# Patient Record
Sex: Female | Born: 1995 | Race: Black or African American | Hispanic: No | Marital: Single | Smoking: Former smoker
Health system: Southern US, Community
[De-identification: ages and names within clinical notes are randomized; demographics above are authoritative.]

## PROBLEM LIST (undated history)

## (undated) DIAGNOSIS — J45909 Unspecified asthma, uncomplicated: Secondary | ICD-10-CM

## (undated) HISTORY — PX: WISDOM TOOTH EXTRACTION: SHX21

## (undated) HISTORY — PX: TONSILLECTOMY: SUR1361

## (undated) HISTORY — PX: ADENOIDECTOMY: SUR15

---
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
## 2018-03-26 IMAGING — DX DG KNEE 1-2V*R*
2 series · 2 of 2 positions shown · non-contrast
Comparison: None.

CLINICAL DATA: Right knee pain for 3 days, no known injury, initial
encounter

EXAM:
RIGHT KNEE - 2 VIEW

[knee ap]
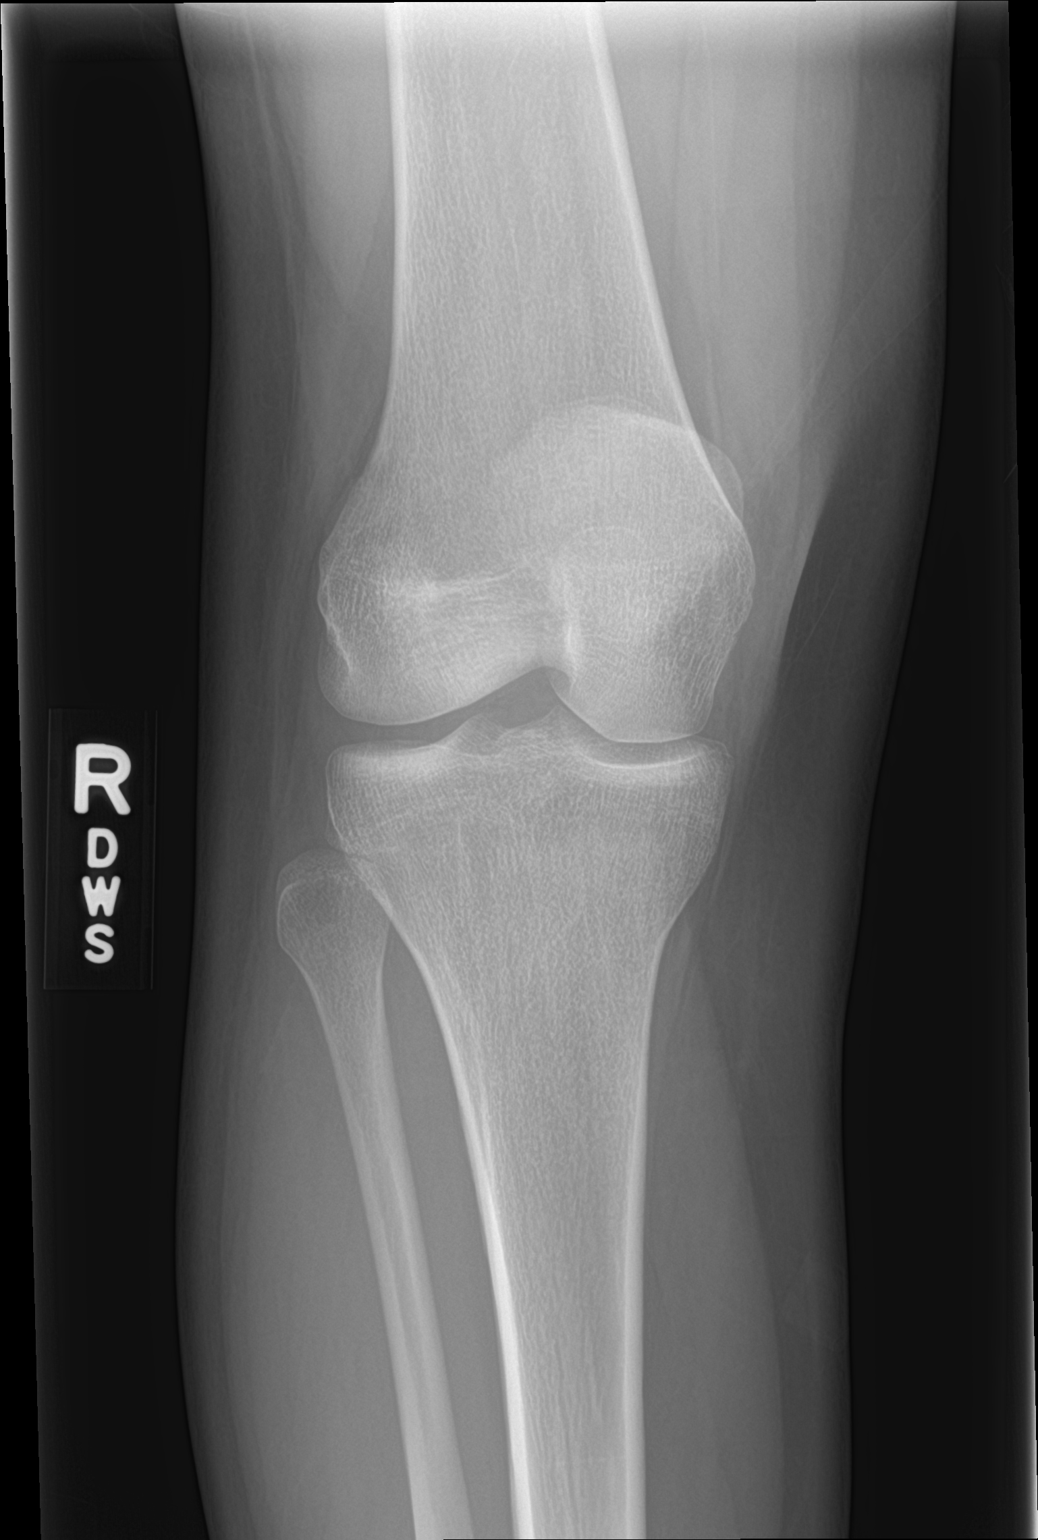

[knee lat]
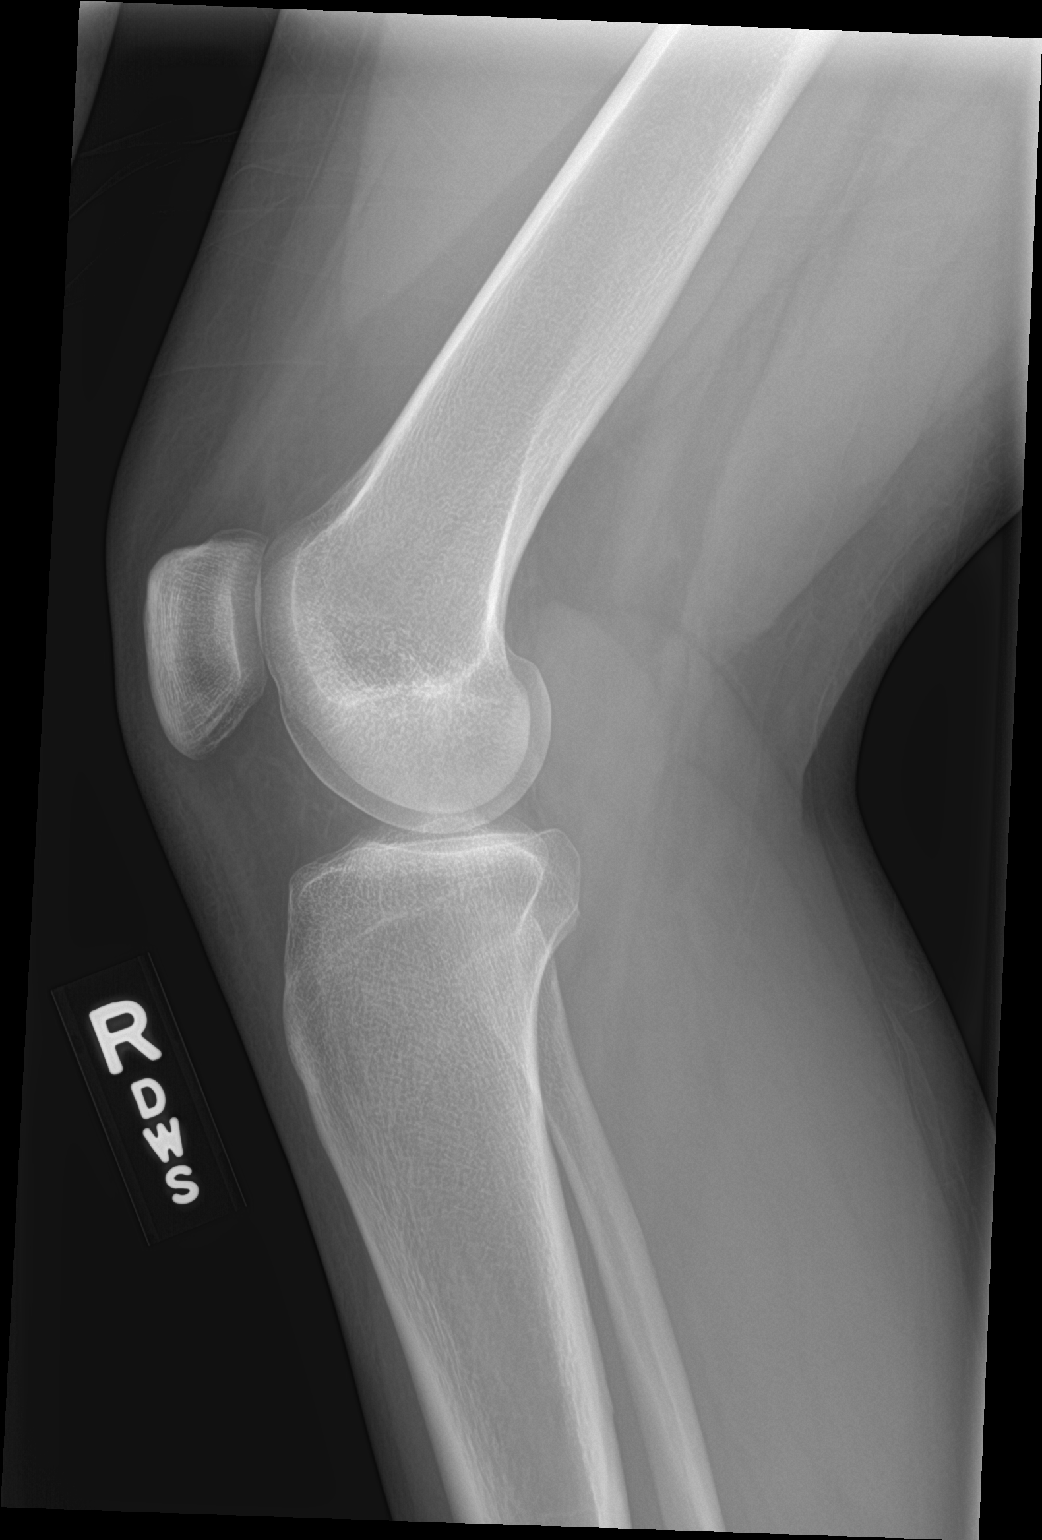

[2 of 2 positions shown; findings below may reference images not displayed]

FINDINGS: No evidence of fracture, dislocation, or joint effusion. No evidence
of arthropathy or other focal bone abnormality. Soft tissues are
unremarkable.
IMPRESSION: No acute abnormality noted.

## 2019-11-18 ENCOUNTER — Other Ambulatory Visit: Payer: Self-pay

## 2019-11-18 ENCOUNTER — Ambulatory Visit (HOSPITAL_COMMUNITY)
Admission: EM | Admit: 2019-11-18 | Discharge: 2019-11-18 | Disposition: A | Discharge status: Home / Self Care | Payer: Self-pay | Attending: Internal Medicine | Admitting: Internal Medicine

## 2019-11-18 ENCOUNTER — Encounter (HOSPITAL_COMMUNITY): Payer: Self-pay

## 2019-11-18 DIAGNOSIS — J029 Acute pharyngitis, unspecified: Secondary | ICD-10-CM | POA: Insufficient documentation

## 2019-11-18 DIAGNOSIS — B2790 Infectious mononucleosis, unspecified without complication: Secondary | ICD-10-CM | POA: Insufficient documentation

## 2019-11-18 LAB — CBC WITH DIFFERENTIAL/PLATELET
Abs Immature Granulocytes: 0.06 10*3/uL (ref 0.00–0.07)
Basophils Absolute: 0.2 10*3/uL — ABNORMAL HIGH (ref 0.0–0.1)
Basophils Relative: 1 %
Eosinophils Absolute: 0 10*3/uL (ref 0.0–0.5)
Eosinophils Relative: 0 %
HCT: 32.4 % — ABNORMAL LOW (ref 36.0–46.0)
Hemoglobin: 10.9 g/dL — ABNORMAL LOW (ref 12.0–15.0)
Immature Granulocytes: 0 %
Lymphocytes Relative: 64 %
Lymphs Abs: 12.4 10*3/uL — ABNORMAL HIGH (ref 0.7–4.0)
MCH: 26.7 pg (ref 26.0–34.0)
MCHC: 33.6 g/dL (ref 30.0–36.0)
MCV: 79.2 fL — ABNORMAL LOW (ref 80.0–100.0)
Monocytes Absolute: 2.2 10*3/uL — ABNORMAL HIGH (ref 0.1–1.0)
Monocytes Relative: 11 %
Neutro Abs: 4.8 10*3/uL (ref 1.7–7.7)
Neutrophils Relative %: 24 %
Platelets: 217 10*3/uL (ref 150–400)
RBC: 4.09 MIL/uL (ref 3.87–5.11)
RDW: 13.7 % (ref 11.5–15.5)
WBC: 19.5 10*3/uL — ABNORMAL HIGH (ref 4.0–10.5)
nRBC: 0 % (ref 0.0–0.2)

## 2019-11-18 LAB — HIV ANTIBODY (ROUTINE TESTING W REFLEX): HIV Screen 4th Generation wRfx: NONREACTIVE

## 2019-11-18 LAB — COMPREHENSIVE METABOLIC PANEL
ALT: 149 U/L — ABNORMAL HIGH (ref 0–44)
AST: 62 U/L — ABNORMAL HIGH (ref 15–41)
Albumin: 3.7 g/dL (ref 3.5–5.0)
Alkaline Phosphatase: 204 U/L — ABNORMAL HIGH (ref 38–126)
Anion gap: 9 (ref 5–15)
BUN: 7 mg/dL (ref 6–20)
CO2: 22 mmol/L (ref 22–32)
Calcium: 9 mg/dL (ref 8.9–10.3)
Chloride: 107 mmol/L (ref 98–111)
Creatinine, Ser: 0.72 mg/dL (ref 0.44–1.00)
GFR calc Af Amer: >60 mL/min (ref >60)
GFR calc non Af Amer: >60 mL/min (ref >60)
Glucose, Bld: 108 mg/dL — ABNORMAL HIGH (ref 70–99)
Potassium: 4.3 mmol/L (ref 3.5–5.1)
Sodium: 138 mmol/L (ref 135–145)
Total Bilirubin: 0.8 mg/dL (ref 0.3–1.2)
Total Protein: 7.4 g/dL (ref 6.5–8.1)

## 2019-11-18 MED ORDER — MAGIC MOUTHWASH W/LIDOCAINE: ORAL | 0 refills | Status: DC

## 2019-11-18 MED ORDER — CVS SORE THROAT SPRAY 1.4 % MT LIQD: 1.0000 | OROMUCOSAL | 0 refills | Status: DC | PRN

## 2019-11-18 NOTE — ED Provider Notes (Signed)
[facility]    CSN: [id number] Arrival date & time: 11/18/19  8242      History   Chief Complaint Chief Complaint  Patient presents with  . Sore Throat    HPI Teresa Saunders is a 24 y.o. female who was recently diagnosed with infectious mononucleosis with severe pharyngitis comes to urgent care with complaints of worsening throat pain, difficulty swallowing.  [facility] and was diagnosed with infectious mononucleosis.  Patient continues to have severe throat pain, difficulty swallowing and poor oral intake.  Patient has swelling in his cervical and posterior occipital glands.  No abdominal pain or distention.  No rash noted on the skin.  No diarrhea.  No night sweats.  No fever or chills. HPI  Past Medical History:  Diagnosis Date  . Asthma     There are no problems to display for this patient.   Past Surgical History:  Procedure Laterality Date  . ADENOIDECTOMY    . TONSILLECTOMY    . WISDOM TOOTH EXTRACTION      OB History   No obstetric history on file.      Home Medications    Prior to Admission medications   Medication Sig Start Date End Date Taking? Authorizing Provider  ibuprofen (ADVIL,MOTRIN) 800 MG tablet Take 1 tablet (800 mg total) by mouth 3 (three) times daily. 06/13/16   Domenick Gong, MD  magic mouthwash w/lidocaine SOLN Please swish and spit every 6hours as needed for throat pain  Formulation: Diphenhydramine 12.5mg /ml- 55ml Maalox-73ml Viscous Lidocaine 2%-60 ml Nystatin 100,000uniuts/ml-60ml 11/18/19   Jenilee Franey, Britta Mccreedy, MD  phenol ([facility] SORE THROAT SPRAY) 1.4 % LIQD Use as directed 1 spray in the mouth or throat as needed for throat irritation / pain. 11/18/19   LampteyBritta Mccreedy, MD    Family History Family History  Problem Relation Age of Onset  . Cancer Mother   . Seizures Sister   . Seizures Brother     Social History Social History   Tobacco Use  . Smoking status: Never  Smoker  . Smokeless tobacco: Never Used  Substance Use Topics  . Alcohol use: Yes    Comment: 1-2 cups of beer 3xwk  . Drug use: Never     Allergies   Patient has no known allergies.   Review of Systems Review of Systems  Constitutional: Positive for activity change, chills and fatigue. Negative for fever.  HENT: Positive for congestion, ear pain and sore throat. Negative for sinus pressure, sinus pain and voice change.   Respiratory: Negative for chest tightness and shortness of breath.   Gastrointestinal: Negative for abdominal pain, nausea and vomiting.  Musculoskeletal: Negative for arthralgias and joint swelling.  Skin: Negative.   Neurological: Negative for dizziness, light-headedness and headaches.  Psychiatric/Behavioral: Negative for confusion and decreased concentration.     Physical Exam Triage Vital Signs ED Triage Vitals [11/18/19 0911]  Enc Vitals Group     BP 135/87     Pulse Rate 99     Resp 18     Temp 98.6 F (37 C)     Temp Source Axillary     SpO2 99 %     Weight 156 lb (70.8 kg)     Height 5\' 3"  (1.6 m)     Head Circumference      Peak Flow      Pain Score 9     Pain Loc      Pain  Edu?      Excl. in [place]?    No data found.  Updated Vital Signs BP 135/87   Pulse 99   Temp 98.6 F (37 C) (Axillary)   Resp 18   Ht 5\' 3"  (1.6 m)   Wt 70.8 kg   SpO2 99%   BMI 27.63 kg/m   Visual Acuity Right Eye Distance:   Left Eye Distance:   Bilateral Distance:    Right Eye Near:   Left Eye Near:    Bilateral Near:     Physical Exam Vitals and nursing note reviewed.  Constitutional:      General: She is in acute distress.     Appearance: She is ill-appearing.  HENT:     Right Ear: Tympanic membrane normal.     Left Ear: Tympanic membrane normal.     Nose: No rhinorrhea.     Mouth/Throat:     Mouth: Mucous membranes are moist.     Pharynx: Uvula midline. Posterior oropharyngeal erythema present.     Tonsils: 1+ on the right. 1+ on the  left.  Cardiovascular:     Rate and Rhythm: Normal rate and regular rhythm.  Pulmonary:     Effort: Pulmonary effort is normal. No respiratory distress.     Breath sounds: No stridor.  Abdominal:     General: There is no distension.     Palpations: Abdomen is soft.     Tenderness: There is no guarding or rebound.  Musculoskeletal:     Cervical back: Normal range of motion.  Lymphadenopathy:     Cervical: Cervical adenopathy present.  Skin:    Capillary Refill: Capillary refill takes less than 2 seconds.  Neurological:     Mental Status: She is alert.      UC Treatments / Results  Labs (all labs ordered are listed, but only abnormal results are displayed) Labs Reviewed  CBC WITH DIFFERENTIAL/PLATELET  COMPREHENSIVE METABOLIC PANEL  HIV ANTIBODY (ROUTINE TESTING W REFLEX)    EKG   Radiology No results found.  Procedures Procedures (including critical care time)  Medications Ordered in UC Medications - No data to display  Initial Impression / Assessment and Plan / UC Course  I have reviewed the triage vital signs and the nursing notes.  Pertinent labs & imaging results that were available during my care of the patient were reviewed by me and considered in my medical decision making (see chart for details).     1.  Infectious mononucleosis with occipital and cervical lymphadenopathy: CBC, BMP, HIV Mouthwash with lidocaine swish and swallow Increase oral fluid intake Phenol throat spray as needed Return precautions given Patient is plan of care will be updated once lab results are available. Final Clinical Impressions(s) / UC Diagnoses   Final diagnoses:  Viral pharyngitis  Infectious mononucleosis without complication, infectious mononucleosis due to unspecified organism   Discharge Instructions   None    ED Prescriptions    Medication Sig Dispense Auth. Provider   phenol ([facility] SORE THROAT SPRAY) 1.4 % LIQD Use as directed 1 spray in the mouth or throat  as needed for throat irritation / pain.  Chase Picket, MD   magic mouthwash w/lidocaine SOLN Please swish and spit 55mls every 6hours as needed for throat pain  Formulation: Diphenhydramine 12.5mg /ml- 72ml Maalox-8ml Viscous Lidocaine 2%-60 ml Nystatin 100,000uniuts/ml-55ml 240 mL Benjiman Sedgwick, Myrene Galas, MD     PDMP not reviewed this encounter.   Chase Picket, MD 11/18/19 1009

## 2019-11-18 NOTE — ED Triage Notes (Signed)
Pt states she went to [facility] on Friday and they pricked her finger and told her she has mono. Pt states the right side of her throat is starting to be more swollen. Pt also states her ears hurt to swallow and talk and she's still having throat pain. Pt states she is able to eat and drink, but it's painful. Pt states [facility] gave her mylanta mouth wash, but it's not helping. Pt states the motrin she's been taking isn't helping and she has nasal congestion.

## 2019-11-19 ENCOUNTER — Telehealth (HOSPITAL_COMMUNITY): Payer: Self-pay | Admitting: Internal Medicine

## 2019-11-19 MED ORDER — PREDNISONE 20 MG PO TABS: 20.0000 mg | ORAL_TABLET | Freq: Every day | ORAL | 0 refills | Status: AC

## 2019-11-19 NOTE — Telephone Encounter (Signed)
Patient continues to have significant throat pain and difficulty swallowing solid foods.  No stridor or difficulty breathing.  No wheezing.  Lab review showed white cell count of 19,000 with reactive lymphocytosis.  Liver enzymes are elevated with ALT of 149 and AST of 62 and alkaline phosphatase of 204.  Short course of steroids will be called into the pharmacy for the patient.  Hopefully that will help with the throat swelling.  I explained to the patient that the steroids has not shorten the duration of the disease process itself but may bring some relief regarding the throat swelling.

## 2020-08-08 ENCOUNTER — Ambulatory Visit: Payer: Medicaid Other

## 2021-03-25 ENCOUNTER — Ambulatory Visit: Payer: Self-pay

## 2021-06-15 ENCOUNTER — Other Ambulatory Visit: Payer: Self-pay

## 2021-06-15 ENCOUNTER — Ambulatory Visit
Admission: EM | Admit: 2021-06-15 | Discharge: 2021-06-15 | Disposition: A | Discharge status: Home / Self Care | Payer: Managed Care, Other (non HMO) | Attending: Emergency Medicine | Admitting: Emergency Medicine

## 2021-06-15 ENCOUNTER — Encounter: Payer: Self-pay | Admitting: Emergency Medicine

## 2021-06-15 DIAGNOSIS — R5383 Other fatigue: Secondary | ICD-10-CM

## 2021-06-15 DIAGNOSIS — R4189 Other symptoms and signs involving cognitive functions and awareness: Secondary | ICD-10-CM

## 2021-06-15 DIAGNOSIS — R110 Nausea: Secondary | ICD-10-CM

## 2021-06-15 DIAGNOSIS — J029 Acute pharyngitis, unspecified: Secondary | ICD-10-CM

## 2021-06-15 DIAGNOSIS — R0602 Shortness of breath: Secondary | ICD-10-CM

## 2021-06-15 DIAGNOSIS — Z8709 Personal history of other diseases of the respiratory system: Secondary | ICD-10-CM

## 2021-06-15 DIAGNOSIS — B349 Viral infection, unspecified: Secondary | ICD-10-CM | POA: Diagnosis not present

## 2021-06-15 MED ORDER — AEROCHAMBER PLUS FLO-VU SMALL MISC: 1.0000 | Freq: Once | Status: DC

## 2021-06-15 MED ORDER — DEXAMETHASONE 6 MG PO TABS: 6.0000 mg | ORAL_TABLET | Freq: Two times a day (BID) | ORAL | 0 refills | Status: DC

## 2021-06-15 MED ORDER — ALBUTEROL SULFATE HFA 108 (90 BASE) MCG/ACT IN AERS: 1.0000 | INHALATION_SPRAY | Freq: Four times a day (QID) | RESPIRATORY_TRACT | 0 refills | Status: DC | PRN

## 2021-06-15 MED ORDER — ALBUTEROL SULFATE HFA 108 (90 BASE) MCG/ACT IN AERS: 2.0000 | INHALATION_SPRAY | Freq: Once | RESPIRATORY_TRACT | Status: DC

## 2021-06-15 MED ORDER — AEROCHAMBER PLUS FLO-VU LARGE MISC: 1.0000 | Freq: Once | 0 refills | Status: AC

## 2021-06-15 NOTE — ED Provider Notes (Signed)
[facility]    CSN: [id number] Arrival date & time: 06/15/21  6644      History   Chief Complaint Chief Complaint  Patient presents with   Chills   Shortness of Breath   Fatigue    HPI Teresa Saunders is a 25 y.o. female.   Pt reports starting yesterday she began feeling fatigued, nauseous, having chills, and having SOB.  Per EMR, patient reports a past medical history of asthma however I do not see any encounters regarding this issue or any former prescriptions for asthma medication or treatment. Patient tested positive for mononucleosis in March 2021.  Patient also reports that she vapes every day.  Patient states that yesterday afternoon at work, she works in a distribution center processing unloading, she began to feel very tired, dizzy, short of breath.  States that that is her busiest day and they get in trouble with a leave early so she just push through and she is about the end of her shift she was feeling better.  Unfortunately, last night she began to experience increased fatigue, chills, feel weak, nausea, dizziness, brain fog and have a sore throat.  Patient denies cough, congestion, headache at this time.  Patient denies sick contacts.  Patient states she is on had episodes like this before.  Patient reports that her history of asthma is remote, states she was very [place] and has not required treatment in greater than 10 years.  Patient describes her shortness of breath as an increased work of breathing, states it is difficult for her to take a very deep breath and when she does does not feel refreshed.  The history is provided by the patient.   Past Medical History:  Diagnosis Date   Asthma     There are no problems to display for this patient.   Past Surgical History:  Procedure Laterality Date   ADENOIDECTOMY     TONSILLECTOMY     WISDOM TOOTH EXTRACTION      OB History   No obstetric history on file.      Home Medications    Prior to  Admission medications   Medication Sig Start Date End Date Taking? Authorizing Provider  albuterol (VENTOLIN HFA) 108 (90 Base) MCG/ACT inhaler Inhale 1-2 puffs into the lungs every 6 (six) hours as needed for wheezing or shortness of breath. 06/15/21  Yes Theadora Rama Scales, PA-C  dexamethasone (DECADRON) 6 MG tablet Take 1 tablet (6 mg total) by mouth 2 (two) times daily with a meal for 5 days. 06/15/21 06/20/21 Yes Theadora Rama Scales, PA-C  Spacer/Aero-Holding Chambers (AEROCHAMBER PLUS FLO-VU LARGE) MISC 1 each by Other route once for 1 dose. 06/15/21 06/15/21 Yes Theadora Rama Scales, PA-C    Family History Family History  Problem Relation Age of Onset   Cancer Mother    Seizures Sister    Seizures Brother     Social History Social History   Tobacco Use   Smoking status: Former    Types: Cigarettes, E-cigarettes   Smokeless tobacco: Current  Vaping Use   Vaping Use: Former  Substance Use Topics   Alcohol use: Yes    Comment: 1-2 cups of beer 3xwk   Drug use: Never     Allergies   Patient has no known allergies.   Review of Systems Review of Systems Pertinent findings noted in history of present illness.    Physical Exam Triage Vital Signs ED Triage Vitals  Enc Vitals Group  BP      Pulse      Resp      Temp      Temp src      SpO2      Weight      Height      Head Circumference      Peak Flow      Pain Score      Pain Loc      Pain Edu?      Excl. in [place]?    No data found.  Updated Vital Signs BP 118/74 (BP Location: Right Arm)   Pulse 67   Temp 97.9 F (36.6 C) (Oral)   Resp 18   Wt 150 lb (68 kg)   LMP 06/01/2021 (Approximate) Comment: Patient states cycles are irregular  SpO2 97%   BMI 26.57 kg/m   Visual Acuity Right Eye Distance:   Left Eye Distance:   Bilateral Distance:    Right Eye Near:   Left Eye Near:    Bilateral Near:     Physical Exam   UC Treatments / Results  Labs (all labs ordered are listed, but  only abnormal results are displayed) Labs Reviewed  COVID-19, FLU A+B NAA    EKG   Radiology No results found.  Procedures Procedures (including critical care time)  Medications Ordered in UC Medications - No data to display  Initial Impression / Assessment and Plan / UC Course  I have reviewed the triage vital signs and the nursing notes.  Pertinent labs & imaging results that were available during my care of the patient were reviewed by me and considered in my medical decision making (see chart for details).     Patient was provided with an albuterol inhaler/spacer as well as a 5-day course of Decadron for decreased breath sounds appreciated on exam and shortness of breath as reported by patient.  Unable to perform albuterol and treatment in office due to not having any albuterol available.  Patient was advised that results of COVID and flu testing would be made available to her once we have received them.  Patient was provided with a note for work.  Patient verbalized understanding and agreement of plan as discussed.  All questions were addressed during visit.  Please see discharge instructions below for further details of plan.  Final Clinical Impressions(s) / UC Diagnoses   Final diagnoses:  Shortness of breath  Sore throat  Other fatigue  Brain fog  Nausea without vomiting  Viral illness  History of asthma     Discharge Instructions      I recommend that you begin inhaling 2 puffs of albuterol twice daily while you are not feeling well and continue to do so whenever you feel that you are having a cough or beginning to feel sick.  Please also begin Decadron for shortness of breath, 1 tablet twice daily for 5 days.  Please quarantine at home until the results of your COVID and flu testing are returned, should be within the next 12 to 24 hours.  Please be advised, if either test is positive you will need to continue to quarantine at home for the next 5 days for COVID  or until symptoms have completely resolved for flu.  Conservative care is recommended including clear fluids, activity as tolerated, rest as tolerated, eating as tolerated, Tylenol and ibuprofen.     ED Prescriptions     Medication Sig Dispense Auth. Provider   dexamethasone (DECADRON) 6 MG tablet Take  1 tablet (6 mg total) by mouth 2 (two) times daily with a meal for 5 days. 10 tablet Theadora Rama Scales, PA-C   Spacer/Aero-Holding Chambers (AEROCHAMBER PLUS FLO-VU LARGE) MISC 1 each by Other route once for 1 dose. 1 each Theadora Rama Scales, PA-C   albuterol (VENTOLIN HFA) 108 (90 Base) MCG/ACT inhaler Inhale 1-2 puffs into the lungs every 6 (six) hours as needed for wheezing or shortness of breath. 18 g Theadora Rama Scales, PA-C      PDMP not reviewed this encounter.   Theadora Rama Scales, PA-C 06/15/21 1049

## 2021-06-15 NOTE — Discharge Instructions (Addendum)
I recommend that you begin inhaling 2 puffs of albuterol twice daily while you are not feeling well and continue to do so whenever you feel that you are having a cough or beginning to feel sick.  Please also begin Decadron for shortness of breath, 1 tablet twice daily for 5 days.  Please quarantine at home until the results of your COVID and flu testing are returned, should be within the next 12 to 24 hours.  Please be advised, if either test is positive you will need to continue to quarantine at home for the next 5 days for COVID or until symptoms have completely resolved for flu.  Conservative care is recommended including clear fluids, activity as tolerated, rest as tolerated, eating as tolerated, Tylenol and ibuprofen.

## 2021-06-15 NOTE — ED Triage Notes (Signed)
Pt reports starting yesterday she began feeling fatigued, nauseous, having chills, and having SOB.  Patient displays no physical s/s of respiratory distress at this time.

## 2021-06-17 LAB — COVID-19, FLU A+B NAA
Influenza A, NAA: NOT DETECTED
Influenza B, NAA: NOT DETECTED
SARS-CoV-2, NAA: NOT DETECTED

## 2021-06-18 ENCOUNTER — Telehealth: Payer: Self-pay

## 2021-06-18 MED ORDER — DEXAMETHASONE 6 MG PO TABS: 6.0000 mg | ORAL_TABLET | Freq: Two times a day (BID) | ORAL | 0 refills | Status: AC

## 2021-06-18 NOTE — Telephone Encounter (Signed)
Pt asked to have rx resent to different pharmacy.

## 2021-07-10 ENCOUNTER — Emergency Department (HOSPITAL_COMMUNITY)
Admission: EM | Admit: 2021-07-10 | Discharge: 2021-07-10 | Disposition: A | Discharge status: Home / Self Care | Payer: Managed Care, Other (non HMO) | Attending: Emergency Medicine | Admitting: Emergency Medicine

## 2021-07-10 ENCOUNTER — Other Ambulatory Visit: Payer: Self-pay

## 2021-07-10 ENCOUNTER — Encounter (HOSPITAL_COMMUNITY): Payer: Self-pay

## 2021-07-10 DIAGNOSIS — J45909 Unspecified asthma, uncomplicated: Secondary | ICD-10-CM | POA: Insufficient documentation

## 2021-07-10 DIAGNOSIS — F17210 Nicotine dependence, cigarettes, uncomplicated: Secondary | ICD-10-CM | POA: Insufficient documentation

## 2021-07-10 DIAGNOSIS — F10929 Alcohol use, unspecified with intoxication, unspecified: Secondary | ICD-10-CM

## 2021-07-10 DIAGNOSIS — Z79899 Other long term (current) drug therapy: Secondary | ICD-10-CM | POA: Diagnosis not present

## 2021-07-10 DIAGNOSIS — F10129 Alcohol abuse with intoxication, unspecified: Secondary | ICD-10-CM | POA: Diagnosis not present

## 2021-07-10 LAB — RAPID URINE DRUG SCREEN, HOSP PERFORMED
Amphetamines: NOT DETECTED
Barbiturates: NOT DETECTED
Benzodiazepines: NOT DETECTED
Cocaine: NOT DETECTED
Opiates: NOT DETECTED
Tetrahydrocannabinol: NOT DETECTED

## 2021-07-10 LAB — POC URINE PREG, ED: Preg Test, Ur: NEGATIVE

## 2021-07-10 MED ORDER — HALOPERIDOL LACTATE 5 MG/ML IJ SOLN
INTRAMUSCULAR | Status: AC
  Administered 2021-07-10: 5 mg
  Filled 2021-07-10: qty 1

## 2021-07-10 MED ORDER — HALOPERIDOL LACTATE 5 MG/ML IJ SOLN: 2.0000 mg | Freq: Once | INTRAMUSCULAR | Status: DC

## 2021-07-10 NOTE — ED Notes (Signed)
Have called all contacts with no answer. Patient does not have phone or wallet with them.

## 2021-07-10 NOTE — ED Provider Notes (Signed)
Patient is now awake and alert.  She has no complaints other than she feels like she is hung over.  She does not appear to be responding to internal stimuli.  She is calm and cooperative.  She would like to go home.  She was ambulating without difficulty.  Will discharge home when she is able to get a hold of her friend.   Gwyneth Sprout, MD 07/10/21 [phone]

## 2021-07-10 NOTE — ED Triage Notes (Addendum)
Patient BIB [facility] at a bar. [employer] arrived. EMS showed up and patient was violent from the start. She was going to go to jail but they thought she was too drunk. Patient arrived in violent restraints. Patient screaming "Get out of my face white cracker bitch." Patient screamed "you took away my reproductive rights by controlling her vagina."    Dr. Nicanor Alcon at bedside gave verbal order 5mg  IM Haldol.

## 2021-07-10 NOTE — ED Provider Notes (Addendum)
[facility]-EMERGENCY DEPT Provider Note   CSN: [id number] Arrival date & time: 07/10/21  0127     History Chief Complaint  Patient presents with   Psychiatric Evaluation   Aggressive Behavior    Teresa Saunders is a 25 y.o. female.  The history is provided by the EMS personnel and the police. The history is limited by the condition of the patient.  Alcohol Intoxication This is a new problem. The problem occurs constantly. The problem has not changed since onset.Pertinent negatives include no shortness of breath. Nothing aggravates the symptoms. Nothing relieves the symptoms. She has tried nothing for the symptoms. The treatment provided no relief.  Found at a bar and was screaming and belligerent and reportedly was drinking ETOH unknown amount.    Past Medical History:  Diagnosis Date   Asthma     There are no problems to display for this patient.   Past Surgical History:  Procedure Laterality Date   ADENOIDECTOMY     TONSILLECTOMY     WISDOM TOOTH EXTRACTION       OB History   No obstetric history on file.     Family History  Problem Relation Age of Onset   Cancer Mother    Seizures Sister    Seizures Brother     Social History   Tobacco Use   Smoking status: Former    Types: Cigarettes, E-cigarettes   Smokeless tobacco: Current  Vaping Use   Vaping Use: Former  Substance Use Topics   Alcohol use: Yes    Comment: 1-2 cups of beer 3xwk   Drug use: Never    Home Medications Prior to Admission medications   Medication Sig Start Date End Date Taking? Authorizing Provider  albuterol (VENTOLIN HFA) 108 (90 Base) MCG/ACT inhaler Inhale 1-2 puffs into the lungs every 6 (six) hours as needed for wheezing or shortness of breath. 06/15/21   Theadora Rama Scales, PA-C  Spacer/Aero-Holding Chambers (AEROCHAMBER PLUS FLO-VU LARGE) MISC 1 each by Other route once for 1 dose. 06/15/21 06/15/21  Theadora Rama Scales, PA-C     Allergies    Patient has no known allergies.  Review of Systems   Review of Systems  Unable to perform ROS: Acuity of condition  HENT:  Negative for facial swelling.   Eyes:  Negative for redness.  Respiratory:  Negative for shortness of breath.   Cardiovascular:  Negative for leg swelling.  Gastrointestinal:  Negative for vomiting.  Musculoskeletal:  Negative for neck stiffness.  Skin:  Negative for wound.  Neurological:  Negative for facial asymmetry.  Psychiatric/Behavioral:  Positive for agitation.    Physical Exam Updated Vital Signs BP (!) 104/47   Pulse 83   Resp 19   SpO2 95%   Physical Exam Vitals and nursing note reviewed. Exam conducted with a chaperone present.  Constitutional:      Appearance: Normal appearance. She is not diaphoretic.  HENT:     Head: Normocephalic and atraumatic.     Nose: Nose normal.     Mouth/Throat:     Mouth: Mucous membranes are moist.     Pharynx: Oropharynx is clear.  Eyes:     Conjunctiva/sclera: Conjunctivae normal.     Pupils: Pupils are equal, round, and reactive to light.  Cardiovascular:     Rate and Rhythm: Normal rate and regular rhythm.     Pulses: Normal pulses.     Heart sounds: Normal heart sounds.  Pulmonary:     Effort: Pulmonary  effort is normal. No respiratory distress.     Breath sounds: Normal breath sounds. No wheezing or rales.  Abdominal:     General: Abdomen is flat. Bowel sounds are normal.     Palpations: Abdomen is soft.     Tenderness: There is no abdominal tenderness. There is no guarding.  Musculoskeletal:        General: Normal range of motion.     Cervical back: Normal range of motion and neck supple.     Right lower leg: No edema.     Left lower leg: No edema.  Skin:    General: Skin is warm and dry.     Capillary Refill: Capillary refill takes less than 2 seconds.  Neurological:     General: No focal deficit present.     Mental Status: She is alert and oriented to person, place, and  time.     Deep Tendon Reflexes: Reflexes normal.  Psychiatric:        Behavior: Behavior is agitated and aggressive.     Comments: Shouting slurs at staff and police     ED Results / Procedures / Treatments   Labs (all labs ordered are listed, but only abnormal results are displayed) Labs Reviewed  RAPID URINE DRUG SCREEN, HOSP PERFORMED  POC URINE PREG, ED    EKG None  Radiology No results found.  Procedures Procedures   Medications Ordered in ED Medications  haloperidol lactate (HALDOL) injection 2 mg (0 mg Intramuscular Hold 07/10/21 0159)  haloperidol lactate (HALDOL) 5 MG/ML injection (5 mg  Given 07/10/21 0158)  haloperidol lactate (HALDOL) 5 MG/ML injection (5 mg  Given 07/10/21 0158)    ED Course  I have reviewed the triage vital signs and the nursing notes.  Pertinent labs & imaging results that were available during my care of the patient were reviewed by me and considered in my medical decision making (see chart for details).   Resting comfortably post haldol.     Teresa Saunders was evaluated in Emergency Department on 07/10/2021 for the symptoms described in the history of present illness. She was evaluated in the context of the global COVID-19 pandemic, which necessitated consideration that the patient might be at risk for infection with the SARS-CoV-2 virus that causes COVID-19. Institutional protocols and algorithms that pertain to the evaluation of patients at risk for COVID-19 are in a state of rapid change based on information released by regulatory bodies including the CDC and federal and state organizations. These policies and algorithms were followed during the patient's care in the ED.  Final Clinical Impression(s) / ED Diagnoses Final diagnoses:  Alcoholic intoxication with complication [facility])   Will need am reassessment by Dr. Lorenza Cambridge, Teresa Termine, MD 07/10/21 [phone]

## 2021-07-27 ENCOUNTER — Other Ambulatory Visit: Payer: Self-pay

## 2021-07-27 ENCOUNTER — Ambulatory Visit
Admission: EM | Admit: 2021-07-27 | Discharge: 2021-07-27 | Disposition: A | Discharge status: Home / Self Care | Payer: Managed Care, Other (non HMO) | Attending: Emergency Medicine | Admitting: Emergency Medicine

## 2021-07-27 DIAGNOSIS — J029 Acute pharyngitis, unspecified: Secondary | ICD-10-CM

## 2021-07-27 DIAGNOSIS — J101 Influenza due to other identified influenza virus with other respiratory manifestations: Secondary | ICD-10-CM | POA: Diagnosis not present

## 2021-07-27 DIAGNOSIS — M791 Myalgia, unspecified site: Secondary | ICD-10-CM

## 2021-07-27 DIAGNOSIS — R059 Cough, unspecified: Secondary | ICD-10-CM

## 2021-07-27 LAB — POCT INFLUENZA A/B
Influenza A, POC: POSITIVE — AB
Influenza B, POC: NEGATIVE

## 2021-07-27 MED ORDER — OSELTAMIVIR PHOSPHATE 75 MG PO CAPS: 75.0000 mg | ORAL_CAPSULE | Freq: Two times a day (BID) | ORAL | 0 refills | Status: DC

## 2021-07-27 NOTE — ED Provider Notes (Addendum)
[facility]    CSN: [id number] Arrival date & time: 07/27/21  1541    HISTORY  No chief complaint on file.  HPI Teresa Saunders is a 25 y.o. female. Patient reports muscle ache, sore throat and cough that started yesterday.  Patient states he took a COVID test at home yesterday and today and that were both negative.  Vital signs are stable on arrival today.  Patient is in no acute distress, well-appearing.  The history is provided by the patient.  Past Medical History:  Diagnosis Date   Asthma    There are no problems to display for this patient.  Past Surgical History:  Procedure Laterality Date   ADENOIDECTOMY     TONSILLECTOMY     WISDOM TOOTH EXTRACTION     OB History   No obstetric history on file.    Home Medications    Prior to Admission medications   Medication Sig Start Date End Date Taking? Authorizing Provider  oseltamivir (TAMIFLU) 75 MG capsule Take 1 capsule (75 mg total) by mouth every 12 (twelve) hours. 07/27/21  Yes Theadora Rama Scales, PA-C  albuterol (VENTOLIN HFA) 108 (90 Base) MCG/ACT inhaler Inhale 1-2 puffs into the lungs every 6 (six) hours as needed for wheezing or shortness of breath. 06/15/21   Theadora Rama Scales, PA-C  Spacer/Aero-Holding Chambers (AEROCHAMBER PLUS FLO-VU LARGE) MISC 1 each by Other route once for 1 dose. 06/15/21 06/15/21  Theadora Rama Scales, PA-C   Family History Family History  Problem Relation Age of Onset   Cancer Mother    Seizures Sister    Seizures Brother    Social History Social History   Tobacco Use   Smoking status: Former    Types: Cigarettes, E-cigarettes   Smokeless tobacco: Current  Vaping Use   Vaping Use: Former  Substance Use Topics   Alcohol use: Yes    Comment: 1-2 cups of beer 3xwk   Drug use: Never   Allergies   Patient has no known allergies.  Review of Systems Review of Systems Pertinent findings noted in history of present illness.   Physical Exam Triage  Vital Signs ED Triage Vitals  Enc Vitals Group     BP 06/25/21 0827 (!) 147/82     Pulse Rate 06/25/21 0827 72     Resp 06/25/21 0827 18     Temp 06/25/21 0827 98.3 F (36.8 C)     Temp Source 06/25/21 0827 Oral     SpO2 06/25/21 0827 98 %     Weight --      Height --      Head Circumference --      Peak Flow --      Pain Score 06/25/21 0826 5     Pain Loc --      Pain Edu? --      Excl. in GC? --   No data found.  Updated Vital Signs BP 113/77 (BP Location: Right Arm)   Pulse 87   Temp 98.6 F (37 C) (Oral)   Resp 16   LMP 06/16/2021 (Approximate)   SpO2 98%   Physical Exam Vitals and nursing note reviewed.  Constitutional:      General: She is not in acute distress.    Appearance: Normal appearance. She is not ill-appearing.  HENT:     Head: Normocephalic and atraumatic.     Salivary Glands: Right salivary gland is not diffusely enlarged or tender. Left salivary gland is not diffusely enlarged or  tender.     Right Ear: Tympanic membrane, ear canal and external ear normal. No drainage. No middle ear effusion. There is no impacted cerumen. Tympanic membrane is not erythematous or bulging.     Left Ear: Tympanic membrane, ear canal and external ear normal. No drainage.  No middle ear effusion. There is no impacted cerumen. Tympanic membrane is not erythematous or bulging.     Nose: Nose normal. No nasal deformity, septal deviation, mucosal edema, congestion or rhinorrhea.     Right Turbinates: Not enlarged, swollen or pale.     Left Turbinates: Not enlarged, swollen or pale.     Right Sinus: No maxillary sinus tenderness or frontal sinus tenderness.     Left Sinus: No maxillary sinus tenderness or frontal sinus tenderness.     Mouth/Throat:     Lips: Pink. No lesions.     Mouth: Mucous membranes are moist. No oral lesions.     Pharynx: Oropharynx is clear. Uvula midline. No posterior oropharyngeal erythema or uvula swelling.     Tonsils: No tonsillar exudate. 0 on the  right. 0 on the left.  Eyes:     General: Lids are normal.        Right eye: No discharge.        Left eye: No discharge.     Extraocular Movements: Extraocular movements intact.     Conjunctiva/sclera: Conjunctivae normal.     Right eye: Right conjunctiva is not injected.     Left eye: Left conjunctiva is not injected.  Neck:     Trachea: Trachea and phonation normal.  Cardiovascular:     Rate and Rhythm: Normal rate and regular rhythm.     Pulses: Normal pulses.     Heart sounds: Normal heart sounds. No murmur heard.   No friction rub. No gallop.  Pulmonary:     Effort: Pulmonary effort is normal. No accessory muscle usage, prolonged expiration or respiratory distress.     Breath sounds: Normal breath sounds. No stridor, decreased air movement or transmitted upper airway sounds. No decreased breath sounds, wheezing, rhonchi or rales.  Chest:     Chest wall: No tenderness.  Musculoskeletal:        General: Normal range of motion.     Cervical back: Normal range of motion and neck supple. Normal range of motion.  Lymphadenopathy:     Cervical: No cervical adenopathy.  Skin:    General: Skin is warm and dry.     Findings: No erythema or rash.  Neurological:     General: No focal deficit present.     Mental Status: She is alert and oriented to person, place, and time.  Psychiatric:        Mood and Affect: Mood normal.        Behavior: Behavior normal.    Visual Acuity Right Eye Distance:   Left Eye Distance:   Bilateral Distance:    Right Eye Near:   Left Eye Near:    Bilateral Near:     UC Couse / Diagnostics / Procedures:    EKG  Radiology No results found.  Procedures Procedures (including critical care time)  UC Diagnoses / Final Clinical Impressions(s)   I have reviewed the triage vital signs and the nursing notes.  Pertinent labs & imaging results that were available during my care of the patient were reviewed by me and considered in my medical decision  making (see chart for details).   Final diagnoses:  Myalgia  Sore  throat  Cough, unspecified type  Influenza A   Influenza test is positive, Tamiflu prescription sent.  Symptomatic care recommended.  Disposition Upon Discharge:  Condition: stable for discharge home Home: take medications as prescribed; routine discharge instructions as discussed; follow up as advised.  Patient presented with an acute illness with associated systemic symptoms and significant discomfort requiring urgent management. In my opinion, this is a condition that a prudent lay person (someone who possesses an average knowledge of health and medicine) may potentially expect to result in complications if not addressed urgently such as respiratory distress, impairment of bodily function or dysfunction of bodily organs.   Routine symptom specific, illness specific and/or disease specific instructions were discussed with the patient and/or caregiver at length.   As such, the patient has been evaluated and assessed, work-up was performed and treatment was provided in alignment with urgent care protocols and evidence based medicine.  Patient/parent/caregiver has been advised that the patient may require follow up for further testing and treatment if the symptoms continue in spite of treatment, as clinically indicated and appropriate.  The patient was tested for COVID-19, Influenza and/or RSV, then the patient/parent/guardian was advised to isolate at home pending the results of his/her diagnostic coronavirus test and potentially longer if they're positive. I have also advised pt that if his/her COVID-19 test returns positive, it's recommended to self-isolate for at least 10 days after symptoms first appeared AND until fever-free for 24 hours without fever reducer AND other symptoms have improved or resolved. Discussed self-isolation recommendations as well as instructions for household member/close contacts as per the [facility] and [place]  DHHS, and also gave patient the COVID packet with this information.  Patient/parent/caregiver has been advised to return to the [facility] or PCP in 3-5 days if no better; to PCP or the Emergency Department if new signs and symptoms develop, or if the current signs or symptoms continue to change or worsen for further workup, evaluation and treatment as clinically indicated and appropriate  The patient will follow up with their current PCP if and as advised. If the patient does not currently have a PCP we will assist them in obtaining one.   The patient may need specialty follow up if the symptoms continue, in spite of conservative treatment and management, for further workup, evaluation, consultation and treatment as clinically indicated and appropriate.  Patient/parent/caregiver verbalized understanding and agreement of plan as discussed.  All questions were addressed during visit.  Please see discharge instructions below for further details of plan.  ED Prescriptions     Medication Sig Dispense Auth. Provider   oseltamivir (TAMIFLU) 75 MG capsule Take 1 capsule (75 mg total) by mouth every 12 (twelve) hours. 10 capsule Theadora Rama Scales, PA-C      PDMP not reviewed this encounter.  Pending results:  Labs Reviewed - No data to display  Medications Ordered in UC: Medications - No data to display  Discharge Instructions:   Discharge Instructions      Your symptoms are most consistent with a viral upper respiratory illness.  Rapid influenza testing today was positive.   Please begin Tamiflu, 1 capsule twice daily for the next 5 days.  Please remain home from work, school, public places until you have been fever free for 24 hours without the use of antifever medications such as Tylenol or ibuprofen.  Continue to use your albuterol inhaler as needed.  Conservative care is recommended at this time.  This includes rest, pushing clear fluids and activity  as tolerated.  You may also  noticed that your appetite is reduced, this is okay as long as they are drinking plenty of clear fluids.  Acetaminophen (Tylenol): This is a good fever reducer.  If there body temperature rises above 101.5 as measured with a thermometer, it is recommended that you give them 1,000 mg every 6-8 hours until they are temperature falls below 101.5, please not take more than 3,000 mg of acetaminophen either as a separate medication or as in ingredient in an over-the-counter cold/flu preparation within a 24-hour period  Ibuprofen  (Advil, Motrin): This is a good anti-inflammatory medication which addresses aches and pains and, to some degree, congestion in the nasal passages.  I recommend giving between 400 to 600 mg every 6-8 hours as needed.  Pseudoephedrine (Sudafed): This is a decongestant.  This medication has to be purchased from the pharmacist counter, I recommend giving 2 tablets, 60 mg, 2-3 times a day as needed to relieve runny nose and sinus drainage.  Guaifenesin (Robitussin, Mucinex): This is an expectorant.  This helps break up chest congestion and loosen up thick nasal drainage making phlegm and drainage more liquid and therefore easier to remove.  I recommend being 400 mg three times daily as needed.  Dextromethorphan (any cough medicine with the letters "DM" added to it's name such as Robitussin DM): This is a cough suppressant.  This is often recommended to be taken at nighttime to suppress cough and help children sleep.  Give dosage as directed on the bottle.   Chloraseptic Throat Spray: Spray 5 sprays into affected area every 2 hours, hold for 15 seconds and either swallow or spit it out.  This is a excellent numbing medication because it is a spray, you can put it right where you needed and so sucking on a lozenge and numbing your entire mouth.  Please follow-up within the next 3 to 5 days either with your primary care provider or urgent care if your symptoms do not resolve.  If you do  not have a primary care provider, we will assist you in finding one.         Theadora Rama Scales, PA-C 07/27/21 1626    Theadora Rama [place], [place] 07/27/21 [phone]

## 2021-07-27 NOTE — ED Triage Notes (Signed)
Pt repots muscle aches, sore throat, and cough. Took at home Covid test yesterday and today that were negative.  Started: Monday

## 2021-07-27 NOTE — Discharge Instructions (Addendum)
Your symptoms are most consistent with a viral upper respiratory illness.  Rapid influenza testing today was positive.   Please begin Tamiflu, 1 capsule twice daily for the next 5 days.  Please remain home from work, school, public places until you have been fever free for 24 hours without the use of antifever medications such as Tylenol or ibuprofen.  Continue to use your albuterol inhaler as needed.  Conservative care is recommended at this time.  This includes rest, pushing clear fluids and activity as tolerated.  You may also noticed that your appetite is reduced, this is okay as long as they are drinking plenty of clear fluids.  Acetaminophen (Tylenol): This is a good fever reducer.  If there body temperature rises above 101.5 as measured with a thermometer, it is recommended that you give them 1,000 mg every 6-8 hours until they are temperature falls below 101.5, please not take more than 3,000 mg of acetaminophen either as a separate medication or as in ingredient in an over-the-counter cold/flu preparation within a 24-hour period  Ibuprofen  (Advil, Motrin): This is a good anti-inflammatory medication which addresses aches and pains and, to some degree, congestion in the nasal passages.  I recommend giving between 400 to 600 mg every 6-8 hours as needed.  Pseudoephedrine (Sudafed): This is a decongestant.  This medication has to be purchased from the pharmacist counter, I recommend giving 2 tablets, 60 mg, 2-3 times a day as needed to relieve runny nose and sinus drainage.  Guaifenesin (Robitussin, Mucinex): This is an expectorant.  This helps break up chest congestion and loosen up thick nasal drainage making phlegm and drainage more liquid and therefore easier to remove.  I recommend being 400 mg three times daily as needed.  Dextromethorphan (any cough medicine with the letters "DM" added to it's name such as Robitussin DM): This is a cough suppressant.  This is often recommended to be  taken at nighttime to suppress cough and help children sleep.  Give dosage as directed on the bottle.   Chloraseptic Throat Spray: Spray 5 sprays into affected area every 2 hours, hold for 15 seconds and either swallow or spit it out.  This is a excellent numbing medication because it is a spray, you can put it right where you needed and so sucking on a lozenge and numbing your entire mouth.  Please follow-up within the next 3 to 5 days either with your primary care provider or urgent care if your symptoms do not resolve.  If you do not have a primary care provider, we will assist you in finding one.

## 2021-08-20 ENCOUNTER — Ambulatory Visit: Payer: Managed Care, Other (non HMO) | Admitting: Family

## 2022-03-15 ENCOUNTER — Ambulatory Visit
Admission: EM | Admit: 2022-03-15 | Discharge: 2022-03-15 | Disposition: A | Discharge status: Home / Self Care | Payer: Managed Care, Other (non HMO) | Attending: Student | Admitting: Student

## 2022-03-15 DIAGNOSIS — S39012A Strain of muscle, fascia and tendon of lower back, initial encounter: Secondary | ICD-10-CM | POA: Diagnosis not present

## 2022-03-15 MED ORDER — IBUPROFEN 600 MG PO TABS
600.0000 mg | ORAL_TABLET | Freq: Three times a day (TID) | ORAL | 0 refills | Status: AC | PRN
Start: 2022-03-15 — End: ?

## 2022-03-15 MED ORDER — LIDOCAINE 5 % EX PTCH
1.0000 | MEDICATED_PATCH | CUTANEOUS | 0 refills | Status: AC
Start: 2022-03-15 — End: ?

## 2022-03-15 MED ORDER — TIZANIDINE HCL 2 MG PO CAPS: 2.0000 mg | ORAL_CAPSULE | Freq: Three times a day (TID) | ORAL | 0 refills | Status: AC

## 2022-03-15 NOTE — ED Triage Notes (Signed)
Bilateral lower back pain x 1 week   Was lifting a heavy suitcase up the stairs when the pain started. Patient describes the pain as a tight sensation and like its bruised. No numbness or tingling in extremities.

## 2022-03-15 NOTE — ED Notes (Signed)
Bilateral lower back pain x 1 week  Was lifting a heavy suitcase up the stairs when the pain started. Patient describes the pain as a tight sensation and like its bruised. No numbness or tingling in extremities  Pain 7/10

## 2022-03-15 NOTE — Discharge Instructions (Addendum)
-  Start the muscle relaxer-Zanaflex (tizanidine), up to 3 times daily for muscle spasms and pain.  This can make you drowsy, so take at bedtime or when you do not need to drive or operate machinery. -You can take Tylenol up to 1000 mg 3 times daily, and ibuprofen up to 600 mg 3 times daily with food.  You can take these together, or alternate every 3-4 hours. -Lidocaine patch, one patch daily. Do not use heating pad simultaneously  -Gentle stretching  -Avoid heavy lifting while pain persists

## 2022-03-15 NOTE — ED Provider Notes (Signed)
[facility]    CSN: [id number] Arrival date & time: 03/15/22  1400      History   Chief Complaint Chief Complaint  Patient presents with   Back Pain    HPI Teresa Saunders is a 26 y.o. female presenting with lower back pain for 1 week following lifting a heavy suitcase.  History noncontributory, denies prior history of back issues.  She is primarily here because she was sent home early from work.  She describes bilateral lumbar paraspinous tenderness, worse with flexion lumbar spine.  She denies radiation of the pain.  Denies new weakness or sensation changes in the arms or the legs.  Denies urinary symptoms including retention or constipation.  HPI  Past Medical History:  Diagnosis Date   Asthma     There are no problems to display for this patient.   Past Surgical History:  Procedure Laterality Date   ADENOIDECTOMY     TONSILLECTOMY     WISDOM TOOTH EXTRACTION      OB History   No obstetric history on file.      Home Medications    Prior to Admission medications   Medication Sig Start Date End Date Taking? Authorizing Provider  ibuprofen (ADVIL) 600 MG tablet Take 1 tablet (600 mg total) by mouth every 8 (eight) hours as needed. 03/15/22  Yes Rhys Martini, PA-C  lidocaine (LIDODERM) 5 % Place 1 patch onto the skin daily. Remove & Discard patch within 12 hours or as directed by MD 03/15/22  Yes Rhys Martini, PA-C  tizanidine (ZANAFLEX) 2 MG capsule Take 1 capsule (2 mg total) by mouth 3 (three) times daily. 03/15/22  Yes Rhys Martini, PA-C  Spacer/Aero-Holding Chambers (AEROCHAMBER PLUS FLO-VU LARGE) MISC 1 each by Other route once for 1 dose. 06/15/21 06/15/21  Theadora Rama Scales, PA-C    Family History Family History  Problem Relation Age of Onset   Cancer Mother    Seizures Sister    Seizures Brother     Social History Social History   Tobacco Use   Smoking status: Former    Types: Cigarettes, E-cigarettes   Smokeless  tobacco: Current  Vaping Use   Vaping Use: Former  Substance Use Topics   Alcohol use: Yes    Comment: 1-2 cups of beer 3xwk   Drug use: Never     Allergies   Patient has no known allergies.   Review of Systems Review of Systems  Musculoskeletal:  Positive for back pain.  All other systems reviewed and are negative.    Physical Exam Triage Vital Signs ED Triage Vitals  Enc Vitals Group     BP 03/15/22 1452 105/71     Pulse Rate 03/15/22 1458 87     Resp 03/15/22 1456 18     Temp 03/15/22 1452 97.7 F (36.5 C)     Temp Source 03/15/22 1452 Oral     SpO2 03/15/22 1452 98 %     Weight --      Height --      Head Circumference --      Peak Flow --      Pain Score 03/15/22 1458 8     Pain Loc --      Pain Edu? --      Excl. in GC? --    No data found.  Updated Vital Signs BP 105/71 (BP Location: Left Arm)   Pulse 87   Temp 97.7 F (36.5 C) (Oral)  Resp 18   SpO2 98%   Visual Acuity Right Eye Distance:   Left Eye Distance:   Bilateral Distance:    Right Eye Near:   Left Eye Near:    Bilateral Near:     Physical Exam Vitals reviewed.  Constitutional:      General: She is not in acute distress.    Appearance: Normal appearance. She is not ill-appearing.  HENT:     Head: Normocephalic and atraumatic.  Cardiovascular:     Rate and Rhythm: Normal rate and regular rhythm.     Heart sounds: Normal heart sounds.  Pulmonary:     Effort: Pulmonary effort is normal.     Breath sounds: Normal breath sounds and air entry.  Abdominal:     Tenderness: There is no abdominal tenderness. There is no right CVA tenderness, left CVA tenderness, guarding or rebound.  Musculoskeletal:     Cervical back: Normal range of motion. No swelling, deformity, signs of trauma, rigidity, spasms, tenderness, bony tenderness or crepitus. No pain with movement.     Thoracic back: No swelling, deformity, signs of trauma, spasms, tenderness or bony tenderness. Normal range of motion.  No scoliosis.     Lumbar back: Spasms and tenderness present. No swelling, deformity, signs of trauma or bony tenderness. Normal range of motion. Negative right straight leg raise test and negative left straight leg raise test. No scoliosis.     Comments: No spinous or paraspinous tenderness to palpation.  Lumbar paraspinous tenderness elicited with flexion lumbar spine. No midline spinous tenderness, deformity, stepoff.  No saddle anesthesia.  Gait intact.  Heel and toe walk intact.  Absolutely no other injury, deformity, tenderness, ecchymosis, abrasion.  Neurological:     General: No focal deficit present.     Mental Status: She is alert.     Cranial Nerves: No cranial nerve deficit.  Psychiatric:        Mood and Affect: Mood normal.        Behavior: Behavior normal.        Thought Content: Thought content normal.        Judgment: Judgment normal.      UC Treatments / Results  Labs (all labs ordered are listed, but only abnormal results are displayed) Labs Reviewed - No data to display  EKG   Radiology No results found.  Procedures Procedures (including critical care time)  Medications Ordered in UC Medications - No data to display  Initial Impression / Assessment and Plan / UC Course  I have reviewed the triage vital signs and the nursing notes.  Pertinent labs & imaging results that were available during my care of the patient were reviewed by me and considered in my medical decision making (see chart for details).     This patient is a very pleasant 26 y.o. year old female presenting with lumbar strain x1 week. No red flag or radicular symptoms. States she is not pregnant or breastfeeding. Zanaflex and lidocaine patch sent. Work note provided. ED return precautions discussed. Patient verbalizes understanding and agreement.   Final Clinical Impressions(s) / UC Diagnoses   Final diagnoses:  Strain of lumbar region, initial encounter     Discharge Instructions       -Start the muscle relaxer-Zanaflex (tizanidine), up to 3 times daily for muscle spasms and pain.  This can make you drowsy, so take at bedtime or when you do not need to drive or operate machinery. -You can take Tylenol up to 1000 mg 3  times daily, and ibuprofen up to 600 mg 3 times daily with food.  You can take these together, or alternate every 3-4 hours. -Lidocaine patch, one patch daily. Do not use heating pad simultaneously  -Gentle stretching  -Avoid heavy lifting while pain persists    ED Prescriptions     Medication Sig Dispense Auth. Provider   tizanidine (ZANAFLEX) 2 MG capsule Take 1 capsule (2 mg total) by mouth 3 (three) times daily. 21 capsule Rhys Martini, PA-C   ibuprofen (ADVIL) 600 MG tablet Take 1 tablet (600 mg total) by mouth every 8 (eight) hours as needed. 30 tablet Rhys Martini, PA-C   lidocaine (LIDODERM) 5 % Place 1 patch onto the skin daily. Remove & Discard patch within 12 hours or as directed by MD 21 patch Rhys Martini, PA-C      PDMP not reviewed this encounter.   Rhys Martini, PA-C 03/15/22 1610

## 2022-05-19 ENCOUNTER — Ambulatory Visit
Admission: RE | Admit: 2022-05-19 | Discharge: 2022-05-19 | Disposition: A | Discharge status: Home / Self Care | Payer: Managed Care, Other (non HMO) | Source: Ambulatory Visit | Attending: Urgent Care

## 2022-05-19 ENCOUNTER — Other Ambulatory Visit: Payer: Self-pay

## 2022-05-19 VITALS — BP 116/80 | HR 87 | Temp 98.7°F | Resp 18

## 2022-05-19 DIAGNOSIS — R42 Dizziness and giddiness: Secondary | ICD-10-CM | POA: Insufficient documentation

## 2022-05-19 DIAGNOSIS — Z720 Tobacco use: Secondary | ICD-10-CM | POA: Diagnosis not present

## 2022-05-19 DIAGNOSIS — Z862 Personal history of diseases of the blood and blood-forming organs and certain disorders involving the immune mechanism: Secondary | ICD-10-CM | POA: Insufficient documentation

## 2022-05-19 DIAGNOSIS — Z20822 Contact with and (suspected) exposure to covid-19: Secondary | ICD-10-CM | POA: Diagnosis not present

## 2022-05-19 DIAGNOSIS — R0602 Shortness of breath: Secondary | ICD-10-CM | POA: Insufficient documentation

## 2022-05-19 DIAGNOSIS — F17290 Nicotine dependence, other tobacco product, uncomplicated: Secondary | ICD-10-CM | POA: Diagnosis not present

## 2022-05-19 MED ORDER — PREDNISONE 20 MG PO TABS: ORAL_TABLET | ORAL | 0 refills | Status: DC

## 2022-05-19 MED ORDER — ALBUTEROL SULFATE HFA 108 (90 BASE) MCG/ACT IN AERS: 1.0000 | INHALATION_SPRAY | Freq: Four times a day (QID) | RESPIRATORY_TRACT | 0 refills | Status: DC | PRN

## 2022-05-19 NOTE — ED Provider Notes (Signed)
[facility]  Note:  This document was prepared using Systems analyst and may include unintentional dictation errors.  MRN: [id number] DOB: 05-02-1996  Subjective:   Teresa Saunders is a 26 y.o. female presenting for 5-day history of acute onset dizziness, intermittent headaches, shortness of breath heart palpitations with minimal exertion.  Has also had postnasal drip and scratchy throat.  She did have an illness in August.  Had fever, cold sweats, shortness of breath and chest tightness, diarrhea.  She was not seen then.  She does have a history of asthma.  She vapes daily multiple times a day.  She also drinks alcohol.  No history of arrhythmias, heart disease.  No family history of the same.  He does not have an inhaler.  No current facility-administered medications for this encounter.  Current Outpatient Medications:    ibuprofen (ADVIL) 600 MG tablet, Take 1 tablet (600 mg total) by mouth every 8 (eight) hours as needed., Disp: 30 tablet, Rfl: 0   lidocaine (LIDODERM) 5 %, Place 1 patch onto the skin daily. Remove & Discard patch within 12 hours or as directed by MD, Disp: 21 patch, Rfl: 0   Spacer/Aero-Holding Chambers (AEROCHAMBER PLUS FLO-VU LARGE) MISC, 1 each by Other route once for 1 dose., Disp: 1 each, Rfl: 0   tizanidine (ZANAFLEX) 2 MG capsule, Take 1 capsule (2 mg total) by mouth 3 (three) times daily., Disp: 21 capsule, Rfl: 0   No Known Allergies  Past Medical History:  Diagnosis Date   Asthma      Past Surgical History:  Procedure Laterality Date   ADENOIDECTOMY     TONSILLECTOMY     WISDOM TOOTH EXTRACTION      Family History  Problem Relation Age of Onset   Cancer Mother    Seizures Sister    Seizures Brother     Social History   Tobacco Use   Smoking status: Former    Types: Cigarettes, E-cigarettes   Smokeless tobacco: Never  Scientific laboratory technician Use: Every day   Substances: Nicotine, Flavoring   Substance Use Topics   Alcohol use: Yes    Comment: 1-2 cups of beer 3xwk   Drug use: Never    ROS   Objective:   Vitals: BP 116/80 (BP Location: Left Arm)   Pulse 87   Temp 98.7 F (37.1 C) (Oral)   Resp 18   LMP 05/17/2022 (Exact Date)   SpO2 96%   Physical Exam Constitutional:      General: She is not in acute distress.    Appearance: Normal appearance. She is well-developed. She is ill-appearing. She is not toxic-appearing or diaphoretic.  HENT:     Head: Normocephalic and atraumatic.     Nose: Nose normal.     Mouth/Throat:     Mouth: Mucous membranes are moist.     Pharynx: No pharyngeal swelling, oropharyngeal exudate, posterior oropharyngeal erythema or uvula swelling.     Tonsils: No tonsillar exudate or tonsillar abscesses. 0 on the right. 0 on the left.  Eyes:     General: No scleral icterus.       Right eye: No discharge.        Left eye: No discharge.     Extraocular Movements: Extraocular movements intact.  Cardiovascular:     Rate and Rhythm: Normal rate and regular rhythm.     Heart sounds: Normal heart sounds. No murmur heard.    No friction rub.  No gallop.  Pulmonary:     Effort: Pulmonary effort is normal. No respiratory distress.     Breath sounds: No stridor. No wheezing, rhonchi or rales.  Chest:     Chest wall: No tenderness.  Skin:    General: Skin is warm and dry.  Neurological:     General: No focal deficit present.     Mental Status: She is alert and oriented to person, place, and time.  Psychiatric:        Mood and Affect: Mood normal.        Behavior: Behavior normal.        Thought Content: Thought content normal.        Judgment: Judgment normal.     ED ECG REPORT   Date: 05/19/2022  EKG Time: 7:51 PM  Rate: 74bpm  Rhythm: normal sinus rhythm,  there are no previous tracings available for comparison  Axis: normal  Intervals:none  ST&T Change: t-wave flattening in aVL  Narrative Interpretation: Sinus rhythm at 74 bpm  with nonspecific T wave changes as above.  No acute findings.  No previous EKG available.   Assessment and Plan :   PDMP not reviewed this encounter.  1. Shortness of breath   2. Dizziness   3. Vapes nicotine containing substance   4. History of anemia    Low suspicion for pulmonary embolism and acute cardiopulmonary event.  However I emphasized that if patient continues to have the symptoms despite our treatment plan she should present to the emergency room for further testing.  Otherwise, in the context of her asthma and vaping daily, recommended on albuterol inhaler, prednisone course.  Respiratory testing pending.  We will otherwise manage with supportive care for an acute viral syndrome.  Counseled patient on potential for adverse effects with medications prescribed/recommended today, ER and return-to-clinic precautions discussed, patient verbalized understanding.    Wallis Bamberg, [place] 05/20/22 [phone]

## 2022-05-19 NOTE — ED Triage Notes (Signed)
Pt presents with dizziness constant x 5 days.  Also has had migraines off and on this week.  No fever, cough, ear pain.  Reports post-nasal drip.  Is getting out of breath and heart fluttering with minimal activity.    Was sick in August with SOB, fever, cold sweats, diarrhea but was never tested for anything.

## 2022-05-20 LAB — RESP PANEL BY RT-PCR (FLU A&B, COVID) ARPGX2
Influenza A by PCR: NEGATIVE
Influenza B by PCR: NEGATIVE
SARS Coronavirus 2 by RT PCR: NEGATIVE

## 2022-05-21 LAB — CBC
Hematocrit: 37.6 % (ref 34.0–46.6)
Hemoglobin: 12 g/dL (ref 11.1–15.9)
MCH: 27.7 pg (ref 26.6–33.0)
MCHC: 31.9 g/dL (ref 31.5–35.7)
MCV: 87 fL (ref 79–97)
Platelets: 217 10*3/uL (ref 150–450)
RBC: 4.33 x10E6/uL (ref 3.77–5.28)
RDW: 13.1 % (ref 11.7–15.4)
WBC: 5.5 10*3/uL (ref 3.4–10.8)

## 2022-07-28 ENCOUNTER — Ambulatory Visit
Admission: EM | Admit: 2022-07-28 | Discharge: 2022-07-28 | Disposition: A | Discharge status: Home / Self Care | Payer: Managed Care, Other (non HMO) | Attending: Urgent Care | Admitting: Urgent Care

## 2022-07-28 DIAGNOSIS — J4530 Mild persistent asthma, uncomplicated: Secondary | ICD-10-CM | POA: Insufficient documentation

## 2022-07-28 DIAGNOSIS — Z720 Tobacco use: Secondary | ICD-10-CM | POA: Insufficient documentation

## 2022-07-28 DIAGNOSIS — Z1152 Encounter for screening for COVID-19: Secondary | ICD-10-CM | POA: Diagnosis not present

## 2022-07-28 DIAGNOSIS — B349 Viral infection, unspecified: Secondary | ICD-10-CM | POA: Insufficient documentation

## 2022-07-28 LAB — POCT RAPID STREP A (OFFICE): Rapid Strep A Screen: NEGATIVE

## 2022-07-28 MED ORDER — PREDNISONE 20 MG PO TABS: ORAL_TABLET | ORAL | 0 refills | Status: AC

## 2022-07-28 MED ORDER — ALBUTEROL SULFATE HFA 108 (90 BASE) MCG/ACT IN AERS: 1.0000 | INHALATION_SPRAY | Freq: Four times a day (QID) | RESPIRATORY_TRACT | 0 refills | Status: AC | PRN

## 2022-07-28 MED ORDER — LEVOCETIRIZINE DIHYDROCHLORIDE 5 MG PO TABS
5.0000 mg | ORAL_TABLET | Freq: Every evening | ORAL | 0 refills | Status: AC
Start: 2022-07-28 — End: ?

## 2022-07-28 MED ORDER — PROMETHAZINE-DM 6.25-15 MG/5ML PO SYRP
2.5000 mL | ORAL_SOLUTION | Freq: Three times a day (TID) | ORAL | 0 refills | Status: AC | PRN
Start: 2022-07-28 — End: ?

## 2022-07-28 NOTE — Discharge Instructions (Addendum)
We will manage this as a viral illness. For sore throat or cough try using a honey-based tea. Use 3 teaspoons of honey with juice squeezed from half lemon. Place shaved pieces of ginger into 1/2-1 cup of water and warm over stove top. Then mix the ingredients and repeat every 4 hours as needed. Please take ibuprofen 600mg  every 6 hours with food alternating with OR taken together with Tylenol 500mg -650mg  every 6 hours for throat pain, fevers, aches and pains. Hydrate very well with at least 2 liters of water. Eat light meals such as soups (chicken and noodles, vegetable, chicken and wild rice).  Do not eat foods that you are allergic to.  Taking an antihistamine like Xyzal can help against postnasal drainage, sinus congestion which can cause sinus pain, sinus headaches, throat pain, painful swallowing, coughing.  You can take this together with prednisone and albuterol for your asthma, congestion, sneezing, drainage. Use the cough medications as needed.

## 2022-07-28 NOTE — ED Triage Notes (Signed)
Pt states sore throat,cough and body aches for the past 5 days.

## 2022-07-28 NOTE — ED Provider Notes (Signed)
[facility]  Note:  This document was prepared using Conservation officer, historic buildings and may include unintentional dictation errors.  MRN: [id number] DOB: January 30, 1996  Subjective:   Teresa Saunders is a 26 y.o. female presenting for 5 day history of acute onset throat pain, coughing, body aches, chest tightness. Patient is requesting a strep and COVID swab. Has asthma, needs a refill on her albuterol. Vapes daily.   No current facility-administered medications for this encounter.  Current Outpatient Medications:    albuterol (VENTOLIN HFA) 108 (90 Base) MCG/ACT inhaler, Inhale 1-2 puffs into the lungs every 6 (six) hours as needed for wheezing or shortness of breath., Disp: 18 g, Rfl: 0   ibuprofen (ADVIL) 600 MG tablet, Take 1 tablet (600 mg total) by mouth every 8 (eight) hours as needed., Disp: 30 tablet, Rfl: 0   lidocaine (LIDODERM) 5 %, Place 1 patch onto the skin daily. Remove & Discard patch within 12 hours or as directed by MD, Disp: 21 patch, Rfl: 0   predniSONE (DELTASONE) 20 MG tablet, Take 2 tablets daily with breakfast., Disp: 10 tablet, Rfl: 0   Spacer/Aero-Holding Chambers (AEROCHAMBER PLUS FLO-VU LARGE) MISC, 1 each by Other route once for 1 dose., Disp: 1 each, Rfl: 0   tizanidine (ZANAFLEX) 2 MG capsule, Take 1 capsule (2 mg total) by mouth 3 (three) times daily., Disp: 21 capsule, Rfl: 0   No Known Allergies  Past Medical History:  Diagnosis Date   Asthma      Past Surgical History:  Procedure Laterality Date   ADENOIDECTOMY     TONSILLECTOMY     WISDOM TOOTH EXTRACTION      Family History  Problem Relation Age of Onset   Cancer Mother    Seizures Sister    Seizures Brother     Social History   Tobacco Use   Smoking status: Former    Types: Cigarettes, E-cigarettes   Smokeless tobacco: Never  Building services engineer Use: Every day   Substances: Nicotine, Flavoring  Substance Use Topics   Alcohol use: Yes    Comment: 1-2  cups of beer 3xwk   Drug use: Never    ROS   Objective:   Vitals: BP 112/69 (BP Location: Left Arm)   Pulse 91   Temp 98.5 F (36.9 C) (Oral)   Resp 16   LMP 06/28/2022 (Approximate)   SpO2 98%   Physical Exam Constitutional:      General: She is not in acute distress.    Appearance: Normal appearance. She is well-developed. She is not ill-appearing, toxic-appearing or diaphoretic.  HENT:     Head: Normocephalic and atraumatic.     Nose: Congestion and rhinorrhea present.     Mouth/Throat:     Mouth: Mucous membranes are moist.     Pharynx: No pharyngeal swelling, oropharyngeal exudate, posterior oropharyngeal erythema or uvula swelling.     Tonsils: No tonsillar exudate or tonsillar abscesses. 0 on the right. 0 on the left.  Eyes:     General: No scleral icterus.       Right eye: No discharge.        Left eye: No discharge.     Extraocular Movements: Extraocular movements intact.  Cardiovascular:     Rate and Rhythm: Normal rate and regular rhythm.     Heart sounds: Normal heart sounds. No murmur heard.    No friction rub. No gallop.  Pulmonary:     Effort: Pulmonary effort  is normal. No respiratory distress.     Breath sounds: No stridor. No wheezing, rhonchi or rales.  Chest:     Chest wall: No tenderness.  Skin:    General: Skin is warm and dry.  Neurological:     General: No focal deficit present.     Mental Status: She is alert and oriented to person, place, and time.  Psychiatric:        Mood and Affect: Mood normal.        Behavior: Behavior normal.     Assessment and Plan :   PDMP not reviewed this encounter.  1. Acute viral syndrome   2. Mild persistent asthma without complication   3. Current every day nicotine vaping     Deferred imaging given clear cardiopulmonary exam, hemodynamically stable vital signs. Will use an oral prednisone course in the context of her asthma. Refilled her inhaler. Strep test negative. Low suspicion for this.  Counseled patient on potential for adverse effects with medications prescribed/recommended today, ER and return-to-clinic precautions discussed, patient verbalized understanding.    Wallis Bamberg, PA-C 07/28/22 1200

## 2022-07-29 LAB — SARS CORONAVIRUS 2 (TAT 6-24 HRS): SARS Coronavirus 2: NEGATIVE
